# Patient Record
Sex: Male | Born: 1975 | Race: Black or African American | Marital: Married
Health system: Midwestern US, Community
[De-identification: ages and names within clinical notes are randomized; demographics above are authoritative.]

## PROBLEM LIST (undated history)

## (undated) DIAGNOSIS — I10 Essential (primary) hypertension: Secondary | ICD-10-CM

## (undated) DIAGNOSIS — J189 Pneumonia, unspecified organism: Secondary | ICD-10-CM

## (undated) DIAGNOSIS — E119 Type 2 diabetes mellitus without complications: Secondary | ICD-10-CM

---

## 2000-10-19 ENCOUNTER — Emergency Department (HOSPITAL_COMMUNITY): Admission: EM | Admit: 2000-10-19 | Discharge: 2000-10-19 | Payer: Self-pay | Admitting: Internal Medicine

## 2000-10-19 ENCOUNTER — Encounter: Payer: Self-pay | Admitting: Internal Medicine

## 2003-10-17 ENCOUNTER — Emergency Department (HOSPITAL_COMMUNITY): Admission: EM | Admit: 2003-10-17 | Discharge: 2003-10-17 | Payer: Self-pay

## 2004-08-19 ENCOUNTER — Emergency Department (HOSPITAL_COMMUNITY): Admission: EM | Admit: 2004-08-19 | Discharge: 2004-08-19 | Payer: Self-pay | Admitting: Emergency Medicine

## 2004-08-20 ENCOUNTER — Emergency Department (HOSPITAL_COMMUNITY): Admission: EM | Admit: 2004-08-20 | Discharge: 2004-08-20 | Payer: Self-pay

## 2005-04-27 ENCOUNTER — Emergency Department (HOSPITAL_COMMUNITY): Admission: EM | Admit: 2005-04-27 | Discharge: 2005-04-27 | Payer: Self-pay | Admitting: Emergency Medicine

## 2006-12-26 ENCOUNTER — Emergency Department (HOSPITAL_COMMUNITY): Admission: EM | Admit: 2006-12-26 | Discharge: 2006-12-26 | Payer: Self-pay | Admitting: Emergency Medicine

## 2007-02-04 ENCOUNTER — Emergency Department (HOSPITAL_COMMUNITY): Admission: EM | Admit: 2007-02-04 | Discharge: 2007-02-04 | Payer: Self-pay | Admitting: Emergency Medicine

## 2008-04-18 ENCOUNTER — Emergency Department (HOSPITAL_COMMUNITY): Admission: EM | Admit: 2008-04-18 | Discharge: 2008-04-18 | Payer: Self-pay | Admitting: Emergency Medicine

## 2008-09-07 ENCOUNTER — Emergency Department (HOSPITAL_COMMUNITY): Admission: EM | Admit: 2008-09-07 | Discharge: 2008-09-07 | Payer: Self-pay | Admitting: Emergency Medicine

## 2008-10-27 ENCOUNTER — Emergency Department (HOSPITAL_COMMUNITY): Admission: EM | Admit: 2008-10-27 | Discharge: 2008-10-27 | Payer: Self-pay | Admitting: Emergency Medicine

## 2009-09-05 ENCOUNTER — Emergency Department (HOSPITAL_COMMUNITY): Admission: EM | Admit: 2009-09-05 | Discharge: 2009-09-05 | Payer: Self-pay | Admitting: Emergency Medicine

## 2010-08-30 ENCOUNTER — Emergency Department (HOSPITAL_COMMUNITY): Admission: EM | Admit: 2010-08-30 | Discharge: 2010-08-31 | Payer: Self-pay | Admitting: Emergency Medicine

## 2010-09-03 ENCOUNTER — Inpatient Hospital Stay (HOSPITAL_COMMUNITY): Admission: EM | Admit: 2010-09-03 | Discharge: 2010-09-05 | Payer: Self-pay | Admitting: Emergency Medicine

## 2011-01-05 ENCOUNTER — Encounter: Payer: Self-pay | Admitting: Family Medicine

## 2011-02-27 LAB — URINALYSIS, ROUTINE W REFLEX MICROSCOPIC
Glucose, UA: NEGATIVE mg/dL
Hgb urine dipstick: NEGATIVE
Protein, ur: NEGATIVE mg/dL
Specific Gravity, Urine: 1.013 (ref 1.005–1.030)
Urobilinogen, UA: 0.2 mg/dL (ref 0.0–1.0)

## 2011-02-27 LAB — DIFFERENTIAL
Basophils Relative: 0 % (ref 0–1)
Lymphocytes Relative: 20 % (ref 12–46)
Monocytes Absolute: 1.2 10*3/uL — ABNORMAL HIGH (ref 0.1–1.0)
Monocytes Relative: 13 % — ABNORMAL HIGH (ref 3–12)

## 2011-02-27 LAB — HEMOGLOBIN A1C
Hgb A1c MFr Bld: 5.5 % (ref <5.7)
Mean Plasma Glucose: 111 mg/dL (ref <117)

## 2011-02-27 LAB — CBC
Hemoglobin: 13.4 g/dL (ref 13.0–17.0)
MCH: 32.3 pg (ref 26.0–34.0)
MCHC: 33.7 g/dL (ref 30.0–36.0)
MCV: 95.7 fL (ref 78.0–100.0)
Platelets: 289 10*3/uL (ref 150–400)
RBC: 4.15 MIL/uL — ABNORMAL LOW (ref 4.22–5.81)
RDW: 12.6 % (ref 11.5–15.5)

## 2011-02-27 LAB — HEPATIC FUNCTION PANEL
ALT: 54 U/L — ABNORMAL HIGH (ref 0–53)
AST: 26 U/L (ref 0–37)
Albumin: 3.1 g/dL — ABNORMAL LOW (ref 3.5–5.2)
Total Bilirubin: 0.9 mg/dL (ref 0.3–1.2)

## 2011-02-27 LAB — COMPREHENSIVE METABOLIC PANEL
ALT: 83 U/L — ABNORMAL HIGH (ref 0–53)
AST: 40 U/L — ABNORMAL HIGH (ref 0–37)
Albumin: 3.1 g/dL — ABNORMAL LOW (ref 3.5–5.2)
BUN: 12 mg/dL (ref 6–23)
CO2: 23 mEq/L (ref 19–32)
Calcium: 8.4 mg/dL (ref 8.4–10.5)
Chloride: 104 mEq/L (ref 96–112)
Creatinine, Ser: 1.1 mg/dL (ref 0.4–1.5)
GFR calc Af Amer: >60 mL/min (ref >60)
GFR calc Af Amer: >60 mL/min (ref >60)
GFR calc non Af Amer: >60 mL/min (ref >60)
Sodium: 135 mEq/L (ref 135–145)
Total Bilirubin: 1 mg/dL (ref 0.3–1.2)
Total Bilirubin: 1.5 mg/dL — ABNORMAL HIGH (ref 0.3–1.2)
Total Protein: 6.5 g/dL (ref 6.0–8.3)

## 2011-02-27 LAB — CULTURE, BLOOD (ROUTINE X 2): Culture  Setup Time: 201109200226

## 2011-02-27 LAB — URINE CULTURE: Colony Count: NO GROWTH

## 2011-02-27 LAB — HEPATITIS PANEL, ACUTE
Hep A IgM: NEGATIVE
Hep B C IgM: NEGATIVE
Hepatitis B Surface Ag: NEGATIVE

## 2011-03-21 LAB — BASIC METABOLIC PANEL
BUN: 17 mg/dL (ref 6–23)
CO2: 25 mEq/L (ref 19–32)
Calcium: 9 mg/dL (ref 8.4–10.5)
Glucose, Bld: 112 mg/dL — ABNORMAL HIGH (ref 70–99)
Sodium: 138 mEq/L (ref 135–145)

## 2011-03-21 LAB — POCT CARDIAC MARKERS: Troponin i, poc: <0.05 ng/mL (ref 0.00–0.09)

## 2011-03-21 LAB — CK TOTAL AND CKMB (NOT AT ARMC): Total CK: 293 U/L — ABNORMAL HIGH (ref 7–232)

## 2011-03-21 LAB — DIFFERENTIAL
Basophils Relative: 1 % (ref 0–1)
Eosinophils Absolute: 0.2 10*3/uL (ref 0.0–0.7)
Eosinophils Relative: 3 % (ref 0–5)
Monocytes Absolute: 0.7 10*3/uL (ref 0.1–1.0)
Monocytes Relative: 9 % (ref 3–12)

## 2011-03-21 LAB — CBC
HCT: 43.5 % (ref 39.0–52.0)
Hemoglobin: 14.4 g/dL (ref 13.0–17.0)
MCHC: 33.3 g/dL (ref 30.0–36.0)
RDW: 13.1 % (ref 11.5–15.5)

## 2012-12-10 ENCOUNTER — Emergency Department (HOSPITAL_COMMUNITY)
Admission: EM | Admit: 2012-12-10 | Discharge: 2012-12-10 | Disposition: A | Discharge status: Home / Self Care | Payer: Self-pay | Attending: Emergency Medicine | Admitting: Emergency Medicine

## 2012-12-10 ENCOUNTER — Encounter (HOSPITAL_COMMUNITY): Payer: Self-pay | Admitting: Emergency Medicine

## 2012-12-10 DIAGNOSIS — R112 Nausea with vomiting, unspecified: Secondary | ICD-10-CM | POA: Insufficient documentation

## 2012-12-10 DIAGNOSIS — E119 Type 2 diabetes mellitus without complications: Secondary | ICD-10-CM | POA: Insufficient documentation

## 2012-12-10 DIAGNOSIS — R631 Polydipsia: Secondary | ICD-10-CM | POA: Insufficient documentation

## 2012-12-10 DIAGNOSIS — R3589 Other polyuria: Secondary | ICD-10-CM | POA: Insufficient documentation

## 2012-12-10 DIAGNOSIS — R358 Other polyuria: Secondary | ICD-10-CM | POA: Insufficient documentation

## 2012-12-10 DIAGNOSIS — I10 Essential (primary) hypertension: Secondary | ICD-10-CM | POA: Insufficient documentation

## 2012-12-10 DIAGNOSIS — R42 Dizziness and giddiness: Secondary | ICD-10-CM | POA: Insufficient documentation

## 2012-12-10 DIAGNOSIS — R509 Fever, unspecified: Secondary | ICD-10-CM | POA: Insufficient documentation

## 2012-12-10 LAB — CBC WITH DIFFERENTIAL/PLATELET
Basophils Absolute: 0 10*3/uL (ref 0.0–0.1)
Basophils Relative: 0 % (ref 0–1)
Hemoglobin: 14.9 g/dL (ref 13.0–17.0)
MCHC: 35 g/dL (ref 30.0–36.0)
Monocytes Relative: 6 % (ref 3–12)
Neutro Abs: 4 10*3/uL (ref 1.7–7.7)
Neutrophils Relative %: 56 % (ref 43–77)

## 2012-12-10 LAB — BASIC METABOLIC PANEL
BUN: 12 mg/dL (ref 6–23)
GFR calc Af Amer: >90 mL/min (ref >90)
GFR calc non Af Amer: 87 mL/min — ABNORMAL LOW (ref >90)
Potassium: 3.9 mEq/L (ref 3.5–5.1)
Sodium: 133 mEq/L — ABNORMAL LOW (ref 135–145)

## 2012-12-10 LAB — GLUCOSE, CAPILLARY
Glucose-Capillary: 333 mg/dL — ABNORMAL HIGH (ref 70–99)
Glucose-Capillary: 253 mg/dL — ABNORMAL HIGH (ref 70–99)

## 2012-12-10 MED ORDER — SODIUM CHLORIDE 0.9 % IV BOLUS (SEPSIS)
1000.0000 mL | Freq: Once | INTRAVENOUS | Status: AC
  Administered 2012-12-10: 1000 mL via INTRAVENOUS

## 2012-12-10 MED ORDER — METFORMIN HCL 500 MG PO TABS: 500.0000 mg | ORAL_TABLET | Freq: Two times a day (BID) | ORAL | Status: AC

## 2012-12-10 MED ORDER — SODIUM CHLORIDE 0.9 % IV SOLN
Freq: Once | INTRAVENOUS | Status: AC
  Administered 2012-12-10: 21:00:00 via INTRAVENOUS

## 2012-12-10 NOTE — ED Notes (Signed)
Pt c/o fatigue, polydipsia, polyuria, but loss of appetite.  Pt states, "I just feel bad".  Sx x 1 month.

## 2012-12-10 NOTE — ED Notes (Signed)
Per pt states increase thirst/urination for one month-changes in vision as well

## 2012-12-10 NOTE — ED Notes (Signed)
pts CBG was 333.

## 2012-12-10 NOTE — ED Provider Notes (Signed)
History     CSN: [id number]  Arrival date & time 12/10/12  1614   First MD Initiated Contact with Patient 12/10/12 1719      Chief Complaint  Patient presents with  . Fatigue  . Polydipsia  . Polyuria     Patient is a 36 y.o. male presenting with weakness. The history is provided by the patient.  Weakness The primary symptoms include headaches, dizziness and nausea. Primary symptoms do not include loss of consciousness, focal weakness, fever or vomiting. The symptoms began more than 1 week ago. The symptoms are worsening. The neurological symptoms are diffuse.  The headache is associated with weakness.  Dizziness also occurs with nausea and weakness. Dizziness does not occur with vomiting.  Additional symptoms include weakness.  pt reports fatigue, dizziness, nausea, polyuria and polydipsia for past several weeks.  He reports his appetite is decreased as well.  No cp/sob.  No severe abd pain.  No fever is reported.  No heavy ETOH use.  He reports his vision is somewhat blurred as well.  He reports mild headaches as well.  He has no h/o diabetes  PMH - HTN  History reviewed. No pertinent past surgical history.  History reviewed. No pertinent family history.  History  Substance Use Topics  . Smoking status: Never Smoker   . Smokeless tobacco: Not on file  . Alcohol Use: No      Review of Systems  Constitutional: Negative for fever.  Eyes: Positive for visual disturbance.  Respiratory: Negative for shortness of breath.   Cardiovascular: Negative for chest pain.  Gastrointestinal: Positive for nausea. Negative for vomiting.  Skin: Negative for color change.  Neurological: Positive for dizziness, weakness and headaches. Negative for focal weakness and loss of consciousness.  Psychiatric/Behavioral: Negative for agitation.  All other systems reviewed and are negative.    Allergies  Codeine and Penicillins  Home Medications   Current Outpatient Rx  Name  Route  Sig   Dispense  Refill  . ATORVASTATIN CALCIUM 10 MG PO TABS   Oral   Take 10 mg by mouth daily.           BP 152/101  Pulse 87  Temp 98.4 F (36.9 C) (Oral)  Resp 18  SpO2 94%  Physical Exam CONSTITUTIONAL: Well developed/well nourished HEAD AND FACE: Normocephalic/atraumatic EYES: EOMI/PERRL ENMT: Mucous membranes dry NECK: supple no meningeal signs SPINE:entire spine nontender CV: S1/S2 noted, no murmurs/rubs/gallops noted LUNGS: Lungs are clear to auscultation bilaterally, no apparent distress ABDOMEN: soft, nontender, no rebound or guarding. He is obese GU:no cva tenderness NEURO: Pt is awake/alert, moves all extremitiesx4 EXTREMITIES: pulses normal, full ROM SKIN: warm, color normal PSYCH: no abnormalities of mood noted  ED Course  Procedures   Labs Reviewed  GLUCOSE, CAPILLARY - Abnormal; Notable for the following:    Glucose-Capillary 333 (*)     All other components within normal limits  BASIC METABOLIC PANEL  CBC WITH DIFFERENTIAL   5:56 PM Pt with likely new onset diabetes.  He has not eaten all day.  He is in no distress.  He does not smell ketotic.  I doubt DKA.  Will need rehydration and will need to be started on metformin.  He reports he has a PCP he can followup with as outpatient. 8:50 PM Pt reports he feels improved Will start metformin.  Stable for d/c  MDM  Nursing notes including past medical history and social history reviewed and considered in documentation Labs/vital reviewed and considered  Joya Gaskins, MD 12/10/12 2050

## 2013-08-10 ENCOUNTER — Encounter (HOSPITAL_COMMUNITY): Payer: Self-pay

## 2013-08-10 ENCOUNTER — Emergency Department (HOSPITAL_COMMUNITY)
Admission: EM | Admit: 2013-08-10 | Discharge: 2013-08-10 | Disposition: A | Discharge status: Home / Self Care | Payer: Self-pay | Attending: Emergency Medicine | Admitting: Emergency Medicine

## 2013-08-10 ENCOUNTER — Emergency Department (HOSPITAL_COMMUNITY): Payer: Self-pay

## 2013-08-10 ENCOUNTER — Encounter: Payer: Self-pay | Admitting: Family Medicine

## 2013-08-10 ENCOUNTER — Ambulatory Visit (INDEPENDENT_AMBULATORY_CARE_PROVIDER_SITE_OTHER): Payer: Self-pay | Admitting: Family Medicine

## 2013-08-10 VITALS — BP 138/76 | HR 81

## 2013-08-10 DIAGNOSIS — R0602 Shortness of breath: Secondary | ICD-10-CM | POA: Insufficient documentation

## 2013-08-10 DIAGNOSIS — R0789 Other chest pain: Secondary | ICD-10-CM

## 2013-08-10 DIAGNOSIS — E119 Type 2 diabetes mellitus without complications: Secondary | ICD-10-CM | POA: Insufficient documentation

## 2013-08-10 DIAGNOSIS — I10 Essential (primary) hypertension: Secondary | ICD-10-CM | POA: Insufficient documentation

## 2013-08-10 DIAGNOSIS — R110 Nausea: Secondary | ICD-10-CM | POA: Insufficient documentation

## 2013-08-10 DIAGNOSIS — Z7982 Long term (current) use of aspirin: Secondary | ICD-10-CM | POA: Insufficient documentation

## 2013-08-10 DIAGNOSIS — Z79899 Other long term (current) drug therapy: Secondary | ICD-10-CM | POA: Insufficient documentation

## 2013-08-10 DIAGNOSIS — R42 Dizziness and giddiness: Secondary | ICD-10-CM | POA: Insufficient documentation

## 2013-08-10 DIAGNOSIS — R079 Chest pain, unspecified: Secondary | ICD-10-CM

## 2013-08-10 DIAGNOSIS — Z880 Allergy status to penicillin: Secondary | ICD-10-CM | POA: Insufficient documentation

## 2013-08-10 DIAGNOSIS — M79609 Pain in unspecified limb: Secondary | ICD-10-CM | POA: Insufficient documentation

## 2013-08-10 HISTORY — DX: Pneumonia, unspecified organism: J18.9

## 2013-08-10 HISTORY — DX: Type 2 diabetes mellitus without complications: E11.9

## 2013-08-10 HISTORY — DX: Essential (primary) hypertension: I10

## 2013-08-10 LAB — CBC
HCT: 40 % (ref 39.0–52.0)
Platelets: 203 10*3/uL (ref 150–400)
RDW: 12.1 % (ref 11.5–15.5)
WBC: 6.1 10*3/uL (ref 4.0–10.5)

## 2013-08-10 LAB — POCT I-STAT TROPONIN I: Troponin i, poc: 0.01 ng/mL (ref 0.00–0.08)

## 2013-08-10 LAB — BASIC METABOLIC PANEL
Chloride: 105 mEq/L (ref 96–112)
GFR calc Af Amer: >90 mL/min (ref >90)
Potassium: 4 mEq/L (ref 3.5–5.1)
Sodium: 139 mEq/L (ref 135–145)

## 2013-08-10 MED ORDER — ONDANSETRON 4 MG PO TBDP
8.0000 mg | ORAL_TABLET | Freq: Once | ORAL | Status: AC
  Administered 2013-08-10: 8 mg via ORAL
  Filled 2013-08-10: qty 2

## 2013-08-10 MED ORDER — IBUPROFEN 800 MG PO TABS
800.0000 mg | ORAL_TABLET | Freq: Once | ORAL | Status: AC
  Administered 2013-08-10: 800 mg via ORAL
  Filled 2013-08-10: qty 1

## 2013-08-10 NOTE — ED Notes (Signed)
Per [facility], pt from [place] for mid sternal cp radiating to right arm, SOB and nausea with pain. Given 324 mg ASA and 1 NTG pain from 8/10 to 0/10 now. Productive cough with yellow sputum x 2 weeks. 18g to LH, 160/100, 70 HR NSR with BBB on 12 lead by EMS.

## 2013-08-10 NOTE — ED Provider Notes (Signed)
CSN: [id number]     Arrival date & time 08/10/13  1034 History   First MD Initiated Contact with Patient 08/10/13 1035     Chief Complaint  Patient presents with  . Chest Pain   (Consider location/radiation/quality/duration/timing/severity/associated sxs/prior Treatment) HPI Comments: Patient with hx of HTN and DM presents for chest pain with onset this morning. Patient states the pain is in his central and right chest and progressed gradually throughout the morning. Patient denies radiation of the pain, but states he also had pain in his RUE. He describes the pain as sharp and pressure-like; relieved with 324mg  ASA and 1 SL NTG by EMS. Patient admits to associated dizziness, nausea and shortness of breath with symptoms. He denies associated fever, diaphoresis, vision changes, jaw pain, vomiting, abdominal pain, numbness/tingling, and extremity weakness. Patient denies a personal hx of ACS, HLD, CAD, and PE/DVT. Patient denies use of hormone replacement as well as any surgeries or hospitalizations in the past month; denies recent travel or long periods of immobilization. Denies a family hx of ACS and CAD.  The history is provided by the patient. No language interpreter was used.    Past Medical History  Diagnosis Date  . Diabetes mellitus without complication   . Hypertension   . Pneumonia    History reviewed. No pertinent past surgical history. History reviewed. No pertinent family history. History  Substance Use Topics  . Smoking status: Never Smoker   . Smokeless tobacco: Not on file  . Alcohol Use: Yes     Comment: occasionally    Review of Systems  Constitutional: Negative for fever.  Eyes: Negative for visual disturbance.  Respiratory: Positive for shortness of breath.   Cardiovascular: Positive for chest pain.  Gastrointestinal: Positive for nausea. Negative for vomiting and abdominal pain.  Neurological: Positive for dizziness. Negative for weakness and numbness.  All  other systems reviewed and are negative.    Allergies  Codeine and Penicillins  Home Medications   Current Outpatient Rx  Name  Route  Sig  Dispense  Refill  . aspirin 325 MG tablet   Oral   Take 650 mg by mouth daily.         Marland Kitchen ibuprofen (ADVIL,MOTRIN) 800 MG tablet   Oral   Take 800 mg by mouth every 8 (eight) hours as needed for pain.         Marland Kitchen lisinopril (PRINIVIL,ZESTRIL) 10 MG tablet   Oral   Take 10 mg by mouth daily.         . metFORMIN (GLUCOPHAGE) 500 MG tablet   Oral   Take 1 tablet (500 mg total) by mouth 2 (two) times daily with a meal.   60 tablet   0    BP 138/77  Pulse 59  Temp(Src) 98.3 F (36.8 C) (Oral)  Resp 16  Ht 6\' 1"  (1.854 m)  Wt 398 lb (180.532 kg)  BMI 52.52 kg/m2  SpO2 99%  Physical Exam  Nursing note and vitals reviewed. Constitutional: He is oriented to person, place, and time. He appears well-developed and well-nourished. No distress.  Morbidly obese male in no acute distress. In no visible or audible discomfort.  HENT:  Head: Normocephalic and atraumatic.  Eyes: Conjunctivae and EOM are normal. No scleral icterus.  Neck: Normal range of motion.  Cardiovascular: Normal rate, regular rhythm, normal heart sounds and intact distal pulses.   Distal radial pulses 2+ bilaterally  Pulmonary/Chest: Effort normal and breath sounds normal. No respiratory distress. He has no  wheezes. He has no rales.  No dyspnea, tachypnea, retractions, or accessory muscle use.  Abdominal: Soft. There is no tenderness. There is no rebound and no guarding.  Musculoskeletal: Normal range of motion.  Neurological: He is alert and oriented to person, place, and time.  Skin: Skin is warm and dry. No rash noted. He is not diaphoretic. No erythema. No pallor.  Psychiatric: He has a normal mood and affect. His behavior is normal.    ED Course  Procedures (including critical care time) Labs Review Labs Reviewed  BASIC METABOLIC PANEL - Abnormal; Notable  for the following:    Glucose, Bld 157 (*)    GFR calc non Af Amer 90 (*)    All other components within normal limits  CBC  POCT I-STAT TROPONIN I  POCT I-STAT TROPONIN I   Imaging Review Dg Chest Port 1 View  08/10/2013   *RADIOLOGY REPORT*  Clinical Data: Mid to right-sided chest pain radiating to the right arm.  Cough and congestion.  Difficulty breathing with nausea.  PORTABLE CHEST - 1 VIEW  Comparison: CT chest and chest radiograph 09/02/2010.  Findings: Trachea is midline.  Heart size is accentuated by apical lordotic AP semi upright technique.  Lungs are clear.  No pleural fluid.  IMPRESSION: No acute findings.   Original Report Authenticated By: Leanna Battles, M.D.    Date: 08/10/2013  Rate: 72  Rhythm: normal sinus rhythm  QRS Axis: normal  Intervals: normal  ST/T Wave abnormalities: normal  Conduction Disutrbances:right bundle branch block; incomplete  Narrative Interpretation: NSR with incomplete right bundle; no STEMI  Old EKG Reviewed: none available I have personally reviewed and interpreted this EKG  MDM   1. Atypical chest pain    37 year old male with a history of diabetes mellitus and hypertension presents for right-sided chest pain with onset this morning. Patient well and nontoxic appearing and afebrile on arrival. Hemodynamically stable throughout ED course. Physical exam findings as above. Chest pain nonreproducible on palpation. Lungs without leukocytosis, anemia, hemoconcentration, or electrolyte imbalance. Troponin x2 within normal limits. There is no evidence of pneumonia, pleural effusion, pneumothorax, or other acute cardiopulmonary abnormality on chest x-ray. EKG with incomplete right bundle; no evidence of STEMI or ischemic changes. Patient with relief of symptoms after being given 325 mg aspirin and one sublingual nitroglycerin by EMS prior to arrival. He has remained asymptomatic throughout ED course. Doubt ACS given lack of history of ACS, atypical  nature of symptoms, physical exam findings, and reassuring ED workup. Patient with TIMI score of 0. Also doubt pulmonary embolism as the patient has been without tachycardia, tachypnea, dyspnea, or hypoxia. Symptoms likely secondary to increased stress at work. Patient appropriate for discharge with cardiology followup. Also recommend patient have outpatient stress test completed. PCP followup advised. Indications for ED return discussed and patient agreeable to plan with no unaddressed concerns.    Antony Madura, PA-C 08/10/13 1447

## 2013-08-10 NOTE — ED Provider Notes (Signed)
Medical screening examination/treatment/procedure(s) were performed by non-physician practitioner and as supervising physician I was immediately available for consultation/collaboration.   Elior Robinette S Roslynn Holte, MD 08/10/13 2000 

## 2013-08-10 NOTE — ED Notes (Signed)
Phlebotomy at bedside to collect I-stat troponin

## 2013-08-15 DIAGNOSIS — R0789 Other chest pain: Secondary | ICD-10-CM | POA: Insufficient documentation

## 2013-08-15 NOTE — Progress Notes (Signed)
  Subjective:    Patient ID: Fernando Roberts, male    DOB: [birth year], 37 y.o.   MRN: [id number]  HPI New pt.  Walked in w/ CP.  Co-worker from [employer] brought pt here when he complained of severe central and R sided CP while at work.  Pt reports waking up w/ CP, nausea, some SOB.  Did not eat breakfast b/c he was feeling so poorly.  Pt's sxs worsened while at work and became sweaty and clammy.  Pt w/ hx of HTN and DM.  Hx of similar CP 'years ago' but can't recall what work up was done at that time.   Review of Systems For ROS see HPI     Objective:   Physical Exam  Vitals reviewed. Constitutional: He is oriented to person, place, and time. He appears well-developed and well-nourished. He appears distressed (clearly uncomfortable).  HENT:  Head: Normocephalic and atraumatic.  Neck: Normal range of motion. Neck supple. No thyromegaly present.  Cardiovascular: Normal rate, regular rhythm, normal heart sounds and intact distal pulses.   Pulmonary/Chest: Effort normal and breath sounds normal. No respiratory distress. He has no wheezes. He has no rales.  Musculoskeletal: He exhibits no edema.  Lymphadenopathy:    He has no cervical adenopathy.  Neurological: He is alert and oriented to person, place, and time.  Skin: There is pallor.  Clammy, diaphoretic  Psychiatric: He has a normal mood and affect. His behavior is normal. Thought content normal.  Appropriately anxious          Assessment & Plan:

## 2013-08-15 NOTE — Assessment & Plan Note (Signed)
New.  Pt was given 4 81mg  ASA in office, O2 via nasal cannula.  EKG w/out obvious infarction but due to pt's sudden onset of sxs, CAD risk factors (obesity, HTN, DM), and clear distress- 911 was called and pt was transported to [facility]

## 2016-07-16 ENCOUNTER — Emergency Department (HOSPITAL_COMMUNITY)
Admission: EM | Admit: 2016-07-16 | Discharge: 2016-07-16 | Disposition: A | Discharge status: Home / Self Care | Payer: Self-pay | Attending: Emergency Medicine | Admitting: Emergency Medicine

## 2016-07-16 ENCOUNTER — Encounter (HOSPITAL_COMMUNITY): Payer: Self-pay

## 2016-07-16 ENCOUNTER — Emergency Department (HOSPITAL_COMMUNITY): Payer: Self-pay

## 2016-07-16 DIAGNOSIS — I10 Essential (primary) hypertension: Secondary | ICD-10-CM | POA: Insufficient documentation

## 2016-07-16 DIAGNOSIS — Z7984 Long term (current) use of oral hypoglycemic drugs: Secondary | ICD-10-CM | POA: Insufficient documentation

## 2016-07-16 DIAGNOSIS — E119 Type 2 diabetes mellitus without complications: Secondary | ICD-10-CM | POA: Insufficient documentation

## 2016-07-16 DIAGNOSIS — R109 Unspecified abdominal pain: Secondary | ICD-10-CM

## 2016-07-16 DIAGNOSIS — K8020 Calculus of gallbladder without cholecystitis without obstruction: Secondary | ICD-10-CM | POA: Insufficient documentation

## 2016-07-16 LAB — CBC WITH DIFFERENTIAL/PLATELET
BASOS PCT: 1 %
Basophils Absolute: 0 10*3/uL (ref 0.0–0.1)
EOS ABS: 0.2 10*3/uL (ref 0.0–0.7)
EOS PCT: 3 %
HCT: 42.1 % (ref 39.0–52.0)
Hemoglobin: 13.4 g/dL (ref 13.0–17.0)
LYMPHS ABS: 2.1 10*3/uL (ref 0.7–4.0)
Lymphocytes Relative: 33 %
MCH: 31.4 pg (ref 26.0–34.0)
MCHC: 31.8 g/dL (ref 30.0–36.0)
MCV: 98.6 fL (ref 78.0–100.0)
MONO ABS: 0.5 10*3/uL (ref 0.1–1.0)
MONOS PCT: 7 %
Neutro Abs: 3.6 10*3/uL (ref 1.7–7.7)
Neutrophils Relative %: 56 %
PLATELETS: 233 10*3/uL (ref 150–400)
RBC: 4.27 MIL/uL (ref 4.22–5.81)
RDW: 12.2 % (ref 11.5–15.5)
WBC: 6.4 10*3/uL (ref 4.0–10.5)

## 2016-07-16 LAB — URINALYSIS, ROUTINE W REFLEX MICROSCOPIC
BILIRUBIN URINE: NEGATIVE
Glucose, UA: NEGATIVE mg/dL
HGB URINE DIPSTICK: NEGATIVE
Ketones, ur: NEGATIVE mg/dL
Leukocytes, UA: NEGATIVE
Nitrite: NEGATIVE
PH: 7 (ref 5.0–8.0)
Protein, ur: NEGATIVE mg/dL
SPECIFIC GRAVITY, URINE: 1.008 (ref 1.005–1.030)

## 2016-07-16 LAB — COMPREHENSIVE METABOLIC PANEL
ALBUMIN: 3.9 g/dL (ref 3.5–5.0)
ALK PHOS: 59 U/L (ref 38–126)
ALT: 27 U/L (ref 17–63)
AST: 25 U/L (ref 15–41)
Anion gap: 7 (ref 5–15)
BILIRUBIN TOTAL: 1.6 mg/dL — AB (ref 0.3–1.2)
BUN: 10 mg/dL (ref 6–20)
CALCIUM: 9.3 mg/dL (ref 8.9–10.3)
CO2: 26 mmol/L (ref 22–32)
CREATININE: 1.43 mg/dL — AB (ref 0.61–1.24)
Chloride: 109 mmol/L (ref 101–111)
GFR calc Af Amer: >60 mL/min (ref >60)
GFR calc non Af Amer: 60 mL/min — ABNORMAL LOW (ref >60)
GLUCOSE: 94 mg/dL (ref 65–99)
Potassium: 3.7 mmol/L (ref 3.5–5.1)
SODIUM: 142 mmol/L (ref 135–145)
TOTAL PROTEIN: 7.1 g/dL (ref 6.5–8.1)

## 2016-07-16 LAB — LIPASE, BLOOD: Lipase: 27 U/L (ref 11–51)

## 2016-07-16 MED ORDER — ONDANSETRON HCL 4 MG/2ML IJ SOLN
4.0000 mg | Freq: Once | INTRAMUSCULAR | Status: AC
  Administered 2016-07-16: 4 mg via INTRAVENOUS
  Filled 2016-07-16: qty 2

## 2016-07-16 MED ORDER — FENTANYL CITRATE (PF) 100 MCG/2ML IJ SOLN
75.0000 ug | Freq: Once | INTRAMUSCULAR | Status: AC
  Administered 2016-07-16: 75 ug via INTRAVENOUS
  Filled 2016-07-16: qty 2

## 2016-07-16 MED ORDER — FENTANYL CITRATE (PF) 100 MCG/2ML IJ SOLN
50.0000 ug | INTRAMUSCULAR | Status: DC | PRN
  Administered 2016-07-16: 50 ug via NASAL

## 2016-07-16 MED ORDER — IOPAMIDOL (ISOVUE-300) INJECTION 61%
INTRAVENOUS | Status: AC
  Administered 2016-07-16: 100 mL
  Filled 2016-07-16: qty 100

## 2016-07-16 MED ORDER — SODIUM CHLORIDE 0.9 % IV SOLN
INTRAVENOUS | Status: DC
  Administered 2016-07-16: 14:00:00 via INTRAVENOUS

## 2016-07-16 MED ORDER — FENTANYL CITRATE (PF) 100 MCG/2ML IJ SOLN
INTRAMUSCULAR | Status: AC
  Filled 2016-07-16: qty 2

## 2016-07-16 MED ORDER — IBUPROFEN 400 MG PO TABS: 400.0000 mg | ORAL_TABLET | Freq: Four times a day (QID) | ORAL | 0 refills | Status: AC | PRN

## 2016-07-16 NOTE — ED Provider Notes (Signed)
[facility]-EMERGENCY DEPT Provider Note   CSN: [id number] Arrival date & time: 07/16/16  0100  First Provider Contact:  None       History   Chief Complaint Chief Complaint  Patient presents with  . Flank Pain    HPI Fernando Roberts is a 40 y.o. male.  Started 2.5 weeks ago but was not that bad.  Thought it might be a pulled muscle.  Today it became more severe.  Started to get nauseated.  It became very severe so he came to the ED.  The pain is on the right side.  He has noticed some urinary frequency recently.    The history is provided by the patient.  Flank Pain  This is a new problem. The current episode started more than 1 week ago. The problem has been rapidly worsening. Pertinent negatives include no shortness of breath. Nothing aggravates the symptoms. Nothing relieves the symptoms. He has tried nothing for the symptoms.    Past Medical History:  Diagnosis Date  . Diabetes mellitus without complication (HCC)   . Hypertension   . Pneumonia     Patient Active Problem List   Diagnosis Date Noted  . Chest pain radiating to arm 08/15/2013    History reviewed. No pertinent surgical history.     Home Medications    Prior to Admission medications   Medication Sig Start Date End Date Taking? Authorizing Provider  ergocalciferol (VITAMIN D2) 50000 units capsule Take 50,000 Units by mouth once a week. monday   Yes Historical Provider, MD  lisinopril (PRINIVIL,ZESTRIL) 10 MG tablet Take 10 mg by mouth daily.   Yes Historical Provider, MD  metFORMIN (GLUCOPHAGE) 500 MG tablet Take 1 tablet (500 mg total) by mouth 2 (two) times daily with a meal. 12/10/12  Yes Zadie Rhine, MD  ibuprofen (ADVIL,MOTRIN) 400 MG tablet Take 1 tablet (400 mg total) by mouth every 6 (six) hours as needed. 07/16/16   Linwood Dibbles, MD    Family History History reviewed. No pertinent family history.  Social History Social History  Substance Use Topics  . Smoking status: Never Smoker  . Smokeless  tobacco: Never Used  . Alcohol use Yes     Comment: occasionally     Allergies   Codeine and Penicillins   Review of Systems Review of Systems  Constitutional: Negative for fever.  Respiratory: Negative for shortness of breath.   Gastrointestinal: Positive for nausea. Negative for diarrhea and vomiting.  Genitourinary: Positive for flank pain.     Physical Exam Updated Vital Signs BP 141/91   Pulse 68   Temp 98.3 F (36.8 C) (Oral)   Resp 26   Ht 6\' 1"  (1.854 m)   Wt (!) 180.5 kg   SpO2 100%   BMI 52.51 kg/m   Physical Exam  Constitutional: He appears well-developed and well-nourished. No distress.  HENT:  Head: Normocephalic and atraumatic. Head is without raccoon's eyes and without Battle's sign.  Right Ear: External ear normal.  Left Ear: External ear normal.  Eyes: Lids are normal. Right eye exhibits no discharge. Right conjunctiva has no hemorrhage. Left conjunctiva has no hemorrhage.  Neck: No spinous process tenderness present. No tracheal deviation and no edema present.  Cardiovascular: Normal rate, regular rhythm and normal heart sounds.   Pulmonary/Chest: Effort normal and breath sounds normal. No stridor. No respiratory distress. He exhibits no tenderness, no crepitus and no deformity.  Abdominal: Soft. Normal appearance and bowel sounds are normal. He exhibits no distension and no  mass. There is no tenderness. There is CVA tenderness (right). No hernia.  Negative for seat belt sign  Musculoskeletal:       Cervical back: He exhibits no tenderness, no swelling and no deformity.       Thoracic back: He exhibits no tenderness, no swelling and no deformity.       Lumbar back: He exhibits no tenderness and no swelling.  Pelvis stable, no ttp  Neurological: He is alert. He has normal strength. No sensory deficit. He exhibits normal muscle tone. GCS eye subscore is 4. GCS verbal subscore is 5. GCS motor subscore is 6.  Able to move all extremities, sensation  intact throughout  Skin: He is not diaphoretic.  Psychiatric: He has a normal mood and affect. His speech is normal and behavior is normal.  Nursing note and vitals reviewed.    ED Treatments / Results  Labs (all labs ordered are listed, but only abnormal results are displayed) Labs Reviewed  COMPREHENSIVE METABOLIC PANEL - Abnormal; Notable for the following:       Result Value   Creatinine, Ser 1.43 (*)    Total Bilirubin 1.6 (*)    GFR calc non Af Amer 60 (*)    All other components within normal limits  URINALYSIS, ROUTINE W REFLEX MICROSCOPIC (NOT AT [facility])  LIPASE, BLOOD  CBC WITH DIFFERENTIAL/PLATELET     Radiology Ct Abdomen Pelvis W Contrast  Result Date: 07/16/2016 CLINICAL DATA:  Right flank pain for several weeks. EXAM: CT ABDOMEN AND PELVIS WITH CONTRAST TECHNIQUE: Multidetector CT imaging of the abdomen and pelvis was performed using the standard protocol following bolus administration of intravenous contrast. CONTRAST:  ISOVUE-300 IOPAMIDOL (ISOVUE-300) INJECTION 61% COMPARISON:  CT scan of September 02, 2010. FINDINGS: Moderate degenerative disc disease is noted at L4-5. Visualized lung bases are unremarkable. Small solitary gallstone is noted. Fatty infiltration of the liver is noted. Spleen and pancreas are unremarkable. Adrenal glands and kidneys appear normal. No hydronephrosis or renal obstruction is noted. The appendix appears normal. There is no evidence of bowel obstruction. No abnormal fluid collection is noted. Urinary bladder appears normal. No significant adenopathy is noted. IMPRESSION: Small solitary gallstone. Fatty infiltration of the liver. No other abnormality seen in the abdomen or pelvis. Electronically Signed   By: Lupita Raider, M.D.   On: 07/16/2016 15:51    Procedures Procedures (including critical care time)  Medications Ordered in ED Medications  fentaNYL (SUBLIMAZE) injection 50 mcg (50 mcg Nasal Given 07/16/16 1213)  fentaNYL  (SUBLIMAZE) 100 MCG/2ML injection (not administered)  0.9 %  sodium chloride infusion ( Intravenous New Bag/Given 07/16/16 1402)  ondansetron (ZOFRAN) injection 4 mg (4 mg Intravenous Given 07/16/16 1402)  fentaNYL (SUBLIMAZE) injection 75 mcg (75 mcg Intravenous Given 07/16/16 1402)  iopamidol (ISOVUE-300) 61 % injection (100 mLs  Contrast Given 07/16/16 1518)     Initial Impression / Assessment and Plan / ED Course  I have reviewed the triage vital signs and the nursing notes.  Pertinent labs & imaging results that were available during my care of the patient were reviewed by me and considered in my medical decision making (see chart for details).  Clinical Course  Comment By Time  Bili elevated.  No blood in the urine.  Will CT abdomen pelvis to evaluate further.  Not clearly renal colic or biliary colic at this time Linwood Dibbles, MD 08/02 1418    Patient's CT scan shows a solitary gallstone without any evidence of inflammation. I'm  not sure that this gallstone as the source of the patient's pain.  His symptoms do not seem to be affecting his appetite and do not seem to be triggered by meals. I think it's reasonable him to follow up with a surgeon for outpatient consultation. I recommend he return to the emergency room if he has fever or vomiting or worsening symptoms.  Final Clinical Impressions(s) / ED Diagnoses   Final diagnoses:  Flank pain  Gallstone    New Prescriptions New Prescriptions   IBUPROFEN (ADVIL,MOTRIN) 400 MG TABLET    Take 1 tablet (400 mg total) by mouth every 6 (six) hours as needed.     Linwood Dibbles, MD 07/16/16 [phone]

## 2016-07-16 NOTE — ED Notes (Signed)
Pt returned from CT. Pt on monitors 

## 2016-07-16 NOTE — ED Notes (Signed)
Nurse First brought pt back c/o right flank pain 10/10 requesting pain meds.

## 2016-07-16 NOTE — ED Triage Notes (Signed)
PT came to the nurse first desk to report he was going to leave if he did not get some pain meds. Pt was moved to triage for for pain eval by triage nurse.

## 2016-07-16 NOTE — ED Notes (Signed)
Pt in ct 

## 2016-07-16 NOTE — ED Triage Notes (Signed)
Pt reports right flank pain X2 weeks, no hx of kidney stone but also reports frequent urination.

## 2017-11-20 IMAGING — CT CT ABD-PELV W/ CM
2 of 5 series · 17 of 46 positions shown, 19 images · IV contrast (APPLIED)
Comparison: CT scan of September 02, 2010.

CLINICAL DATA: Right flank pain for several weeks.

EXAM:
CT ABDOMEN AND PELVIS WITH CONTRAST
TECHNIQUE: Multidetector CT imaging of the abdomen and pelvis was performed
using the standard protocol following bolus administration of
intravenous contrast.
CONTRAST:  100mL AU4TJR-TFF IOPAMIDOL (AU4TJR-TFF) INJECTION 61%

[Series 2: abd/ pelvis 5.0 i30f 1 · axial · 0.95mm/px · z∈[+776,+1292]mm · 14 of 115 slices shown, 16 images]
[im 6/115  soft-tissue]
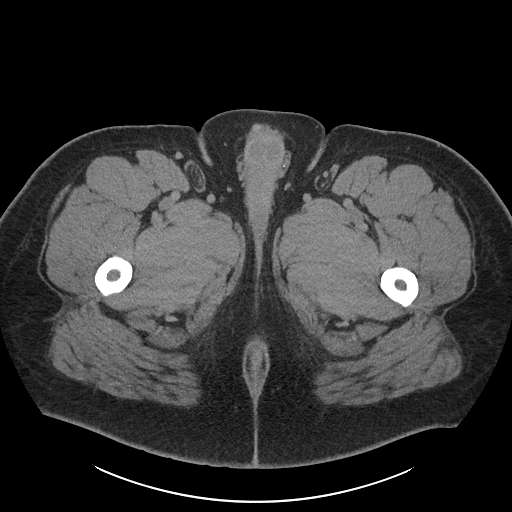
[im 6/115  bone]
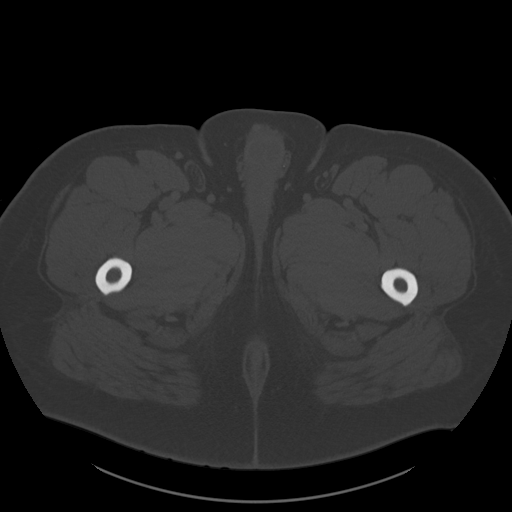
[im 18/115  soft-tissue]
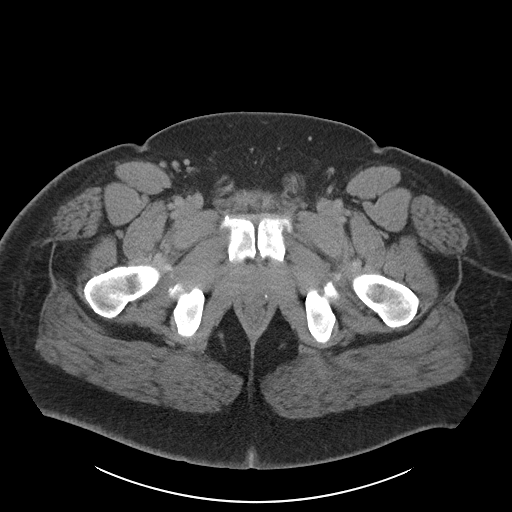
[im 23/115  soft-tissue]
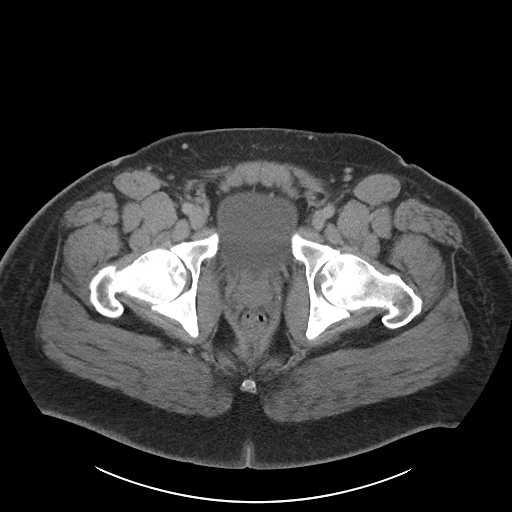
[im 29/115  soft-tissue]
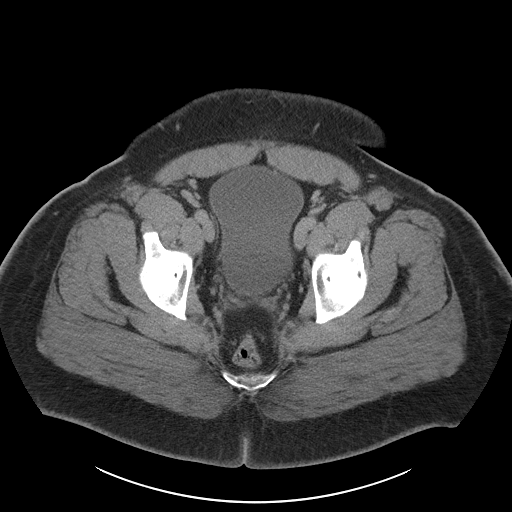
[im 40/115  soft-tissue]
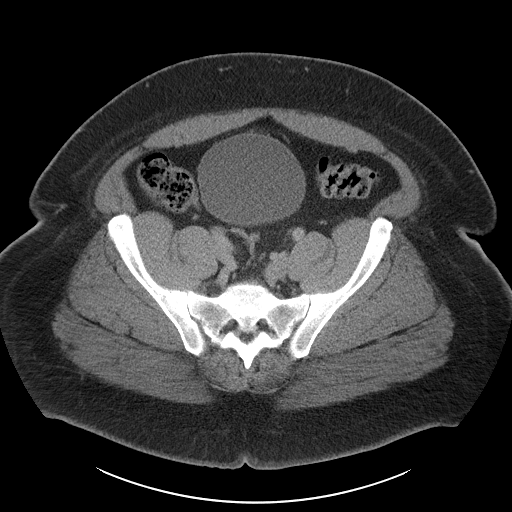
[im 46/115  soft-tissue]
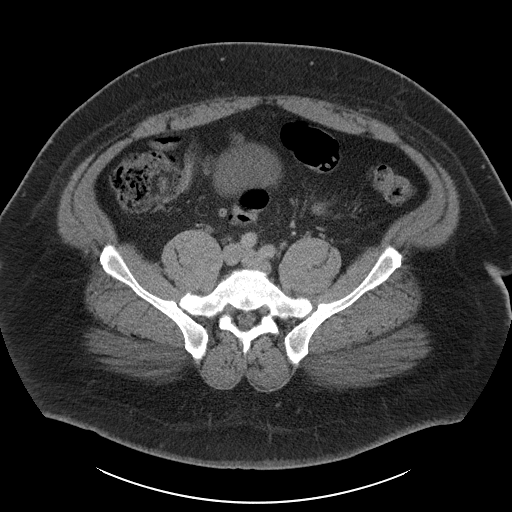
[im 52/115  soft-tissue]
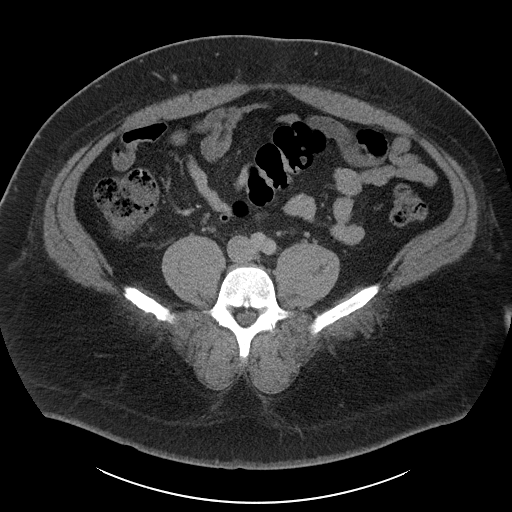
[im 63/115  soft-tissue]
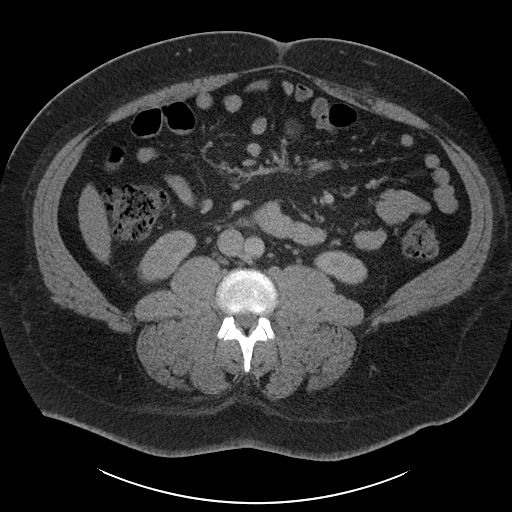
[im 69/115  soft-tissue]
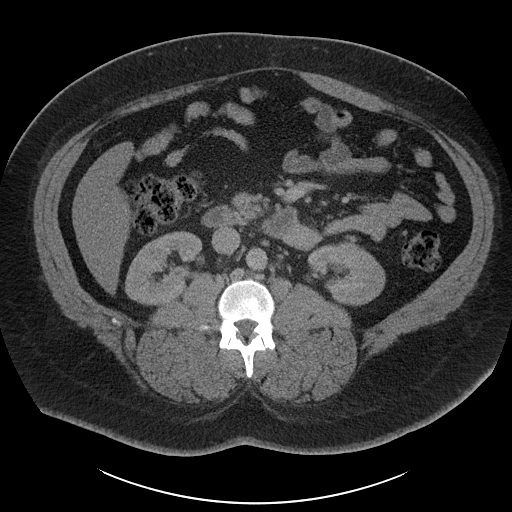
[im 69/115  bone]
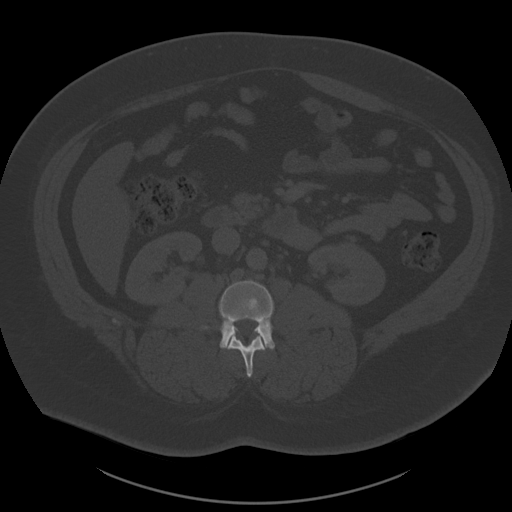
[im 75/115  soft-tissue]
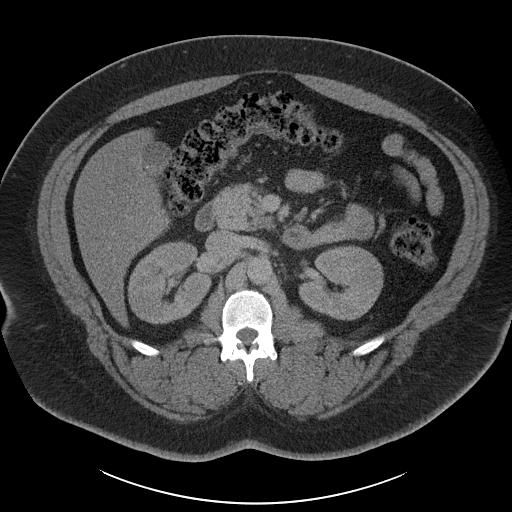
[im 86/115  soft-tissue]
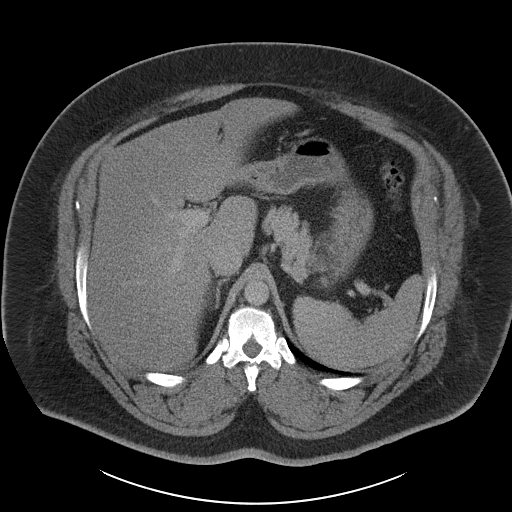
[im 92/115  soft-tissue]
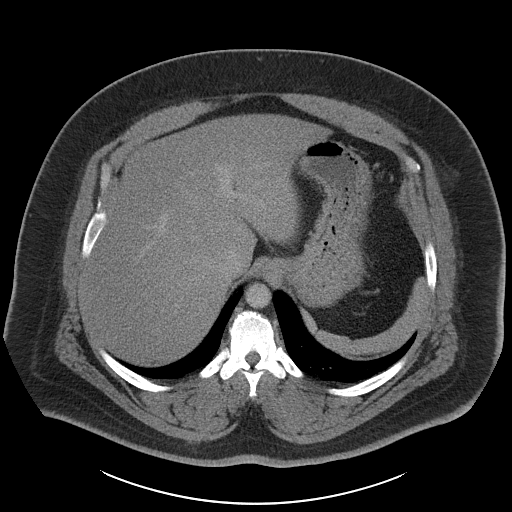
[im 97/115  soft-tissue]
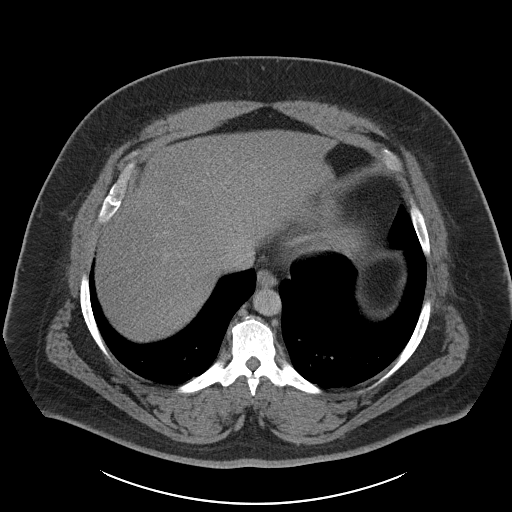
[im 109/115  soft-tissue]
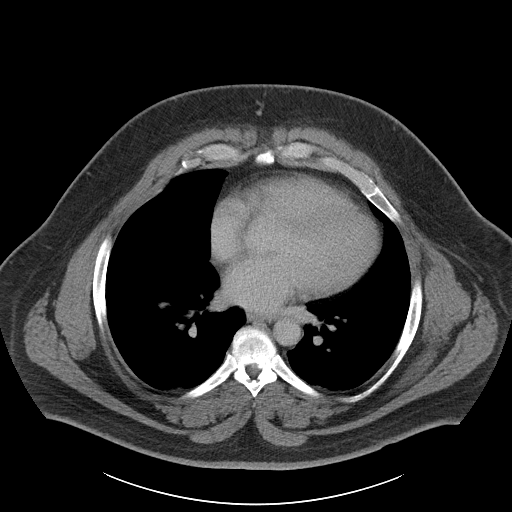

[Series 5: coronal soft tissue · coronal · 1.04mm/px · 3 of 125 slices shown]
[im 42/125  soft-tissue]
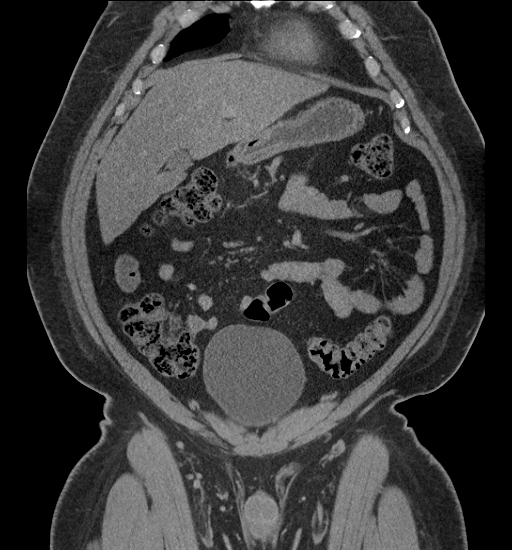
[im 56/125  soft-tissue]
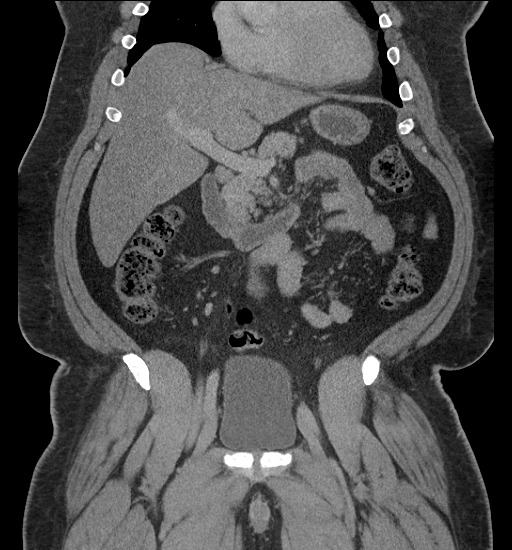
[im 69/125  soft-tissue]
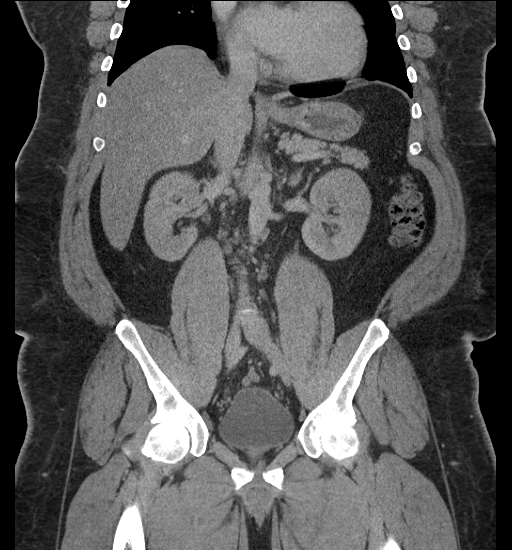

[17 of 46 positions shown; findings below may reference images not displayed]

FINDINGS: Moderate degenerative disc disease is noted at L4-5. Visualized lung
bases are unremarkable.

Small solitary gallstone is noted. Fatty infiltration of the liver
is noted. Spleen and pancreas are unremarkable. Adrenal glands and
kidneys appear normal. No hydronephrosis or renal obstruction is
noted. The appendix appears normal. There is no evidence of bowel
obstruction. No abnormal fluid collection is noted. Urinary bladder
appears normal. No significant adenopathy is noted.
IMPRESSION: Small solitary gallstone. Fatty infiltration of the liver. No other
abnormality seen in the abdomen or pelvis.

## 2021-07-15 NOTE — Telephone Encounter (Signed)
Formatting of this note is different from the original.  PC to patient for payment see email copied below. Appointments canceled and patient will contact us when he is ready to reschedule.  [place] aware.    From: Garnet Koyanagi A. [email or web address]   Sent: Monday, July 15, 2021 12:26 PM  To: Marchesini, Sherry [email or web address]>; Benna Dunks, Heidi A. [email or web address]>; Valera Castle. [email or web address]>; Cherylann Ratel [email or web address]>  Subject: RE: payment    Hi all    I contacted Abou Sterkel III Mrn [phone] and he is cancelling his appt fr now and call when he is able to do process.    From: Loraine Maple [email or web address]   Sent: Monday, July 15, 2021 9:23 AM  To: Dorthula Rue A. [email or web address]>; Valera Castle. [email or web address]>; Cherylann Ratel [email or web address]>; Eitzen, Cordelia Pen A. [email or web address]>  Subject: RE: payment    Hi Cordelia Pen,    Since Angelique Blonder has been off Friday and today can you call the following patients to collect $864.00 Evaluation payment.    Patient MRN  DOS Service Provider Due   OSA, CAMPOLI [id number] 08/06/2021 Evaluation [place] $864.00       Thank you,  Cordelia Pen    Electronically signed by Tiana Loft, RN at 07/15/2021  1:15 PM EDT

## 2022-12-02 NOTE — Progress Notes (Signed)
Formatting of this note is different from the original.  Subjective     HPI:  John Brady is a 46 y.o.  male who presents to the office with:    Chief Complaint   Patient presents with   ? Follow-up     Patient states he is dealing with sudden abdominal pain, bad headaches, dizziness and acid reflex. These symptom's are daily issues x 3 weeks denies fever. Has been taking OTC pain medications and Alka-Seltzer to try to make himself feel better. MD     Patient reports epigastric abdominal pain and in general not feeling well for the past 3 weeks.  No fever cough or URI symptoms.    Ibuprofen 2 pills- every 8 hours.  for chronic knee pain-we reviewed and he is aware that NSAID contraindicated  for patient status post bariatric surgery  Alcohol - beer 48+ ounces daily  No tobacco.    Not taking HCTZ regularly for BP     BP prescribed venlafaxine for depression in March, he does not report adequate trial and thinks depression may be playing a role in compliance.  BP Readings from Last 3 Encounters:   12/02/22 (!) 142/100   03/16/22 (!) 139/91   03/11/22 138/88     Wt Readings from Last 3 Encounters:   12/02/22 (!) 356 lb 9.6 oz (161.8 kg)   03/16/22 (!) 352 lb (159.7 kg)   03/11/22 (!) 352 lb 12.8 oz (160 kg)     ROS: Negative for constitutional, CV, Resp, Abd/GU symptoms except as noted above in HPI.    PMH: Past medical history, Past surgical history, Social history, family history were reviewed as noted in EMR.  Pertinent for:   Past Medical History:   Diagnosis Date   ? Abdominal pain, unspecified site    ? Contact dermatitis    ? Depression    ? Dermatophytosis of nail    ? Diabetes mellitus (*)    ? Disease of thyroid gland     PATIENT DENIES   ? Essential hypertension    ? Fever, unspecified    ? Hyperlipidemia    ? Obstructive sleep apnea syndrome    ? Pain in joint, lower leg    ? Plantar fasciitis    ? Pneumonia    ? Primary osteoarthritis of knee    ? Type 2 diabetes mellitus with hyperglycemia (*)      FSBS RANGE 110-120 04/06/17   ? Unspecified internal derangement of knee      Medications and allergies reviewed.    Objective     Physical Exam:   Vital Signs: BP (!) 142/100 (BP Location: Right arm, Patient Position: Lying)   Pulse 89   Temp 97.5 F (36.4 C) (Temporal)   Ht 6\' 1"  (1.854 m)   Wt (!) 356 lb 9.6 oz (161.8 kg)   SpO2 95%   BMI 47.05 kg/m    General:  Well appearing, Alert & Oriented  CV:  Normal to palpation without thrill.  Regular Rate and Rhythm, No murmurs, rubs, or gallops.    Respiratory:  Normal respiratory effort.  No wheezes or crackles.  Good air movement without diminished sounds.    Abdomen:  Normal bowel sounds.  Non-tender to palpation.  No rebound or guarding.  No palpable liver or spleen.  Extremities:  No pretibial edema    Assessment/Plan     Orders Placed This Encounter   Procedures   ? Comprehensive Metabolic Panel   ?  CBC And Differential   ? Hemoglobin A1c   ? TSH Rfx on Abnormal to Free T4   ? Lipase   ? H. pylori breath test   ? T4 Free     Episode of recurrent major depressive disorder (*)   Does not recall what he has taken in the past or any adverse effects-perhaps some erectile dysfunction but not clear if this was already present at baseline does admit an association between alcohol use and ED.  Prescribed venlafaxine in March and it looks like several years ago may have been prescribed Lexapro.  After discussion patient would like to restart venlafaxine and request a new prescription.  Encourage therapy.  Will reassess closely in 6 weeks    H/O gastric bypass   Reviewed importance of NSAID avoidance    Alcohol abuse, daily use   We discussed alcohol use is a risk factor for depression, ulcers, pancreatitis, liver dysfunction, hypertension and recommended cutting back.  He declines further support at this time    Abdominal pain   Patient reports epigastric abdominal pain and on exam patient has some mild right upper quadrant pain without rebound.  Patient also  reports back pain, possibly musculoskeletal.  No history of kidney stones.  We discussed differential includes pancreatitis, peptic ulcer, known cholelithiasis , musculoskeletal back pain and less likely kidney stones.    Heart with labs today to further narrow differential.  Will start PPI, recommended decreasing alcohol use, bland diet and will monitor closely.  Discussed red flags for urgent reevaluation    Essential hypertension with goal blood pressure less than 130/80   BP elevated, encouraged to restart hctz    Follow up in about 6 weeks (around 01/13/2023).    Future Appointments   Date Time Provider Department Center   01/13/2023 12:00 PM Misty Stanley, PA-C [facility] None     Medications at end of visit today:    Current Outpatient Medications:   ?  albuterol sulfate HFA (VENTOLIN HFA) 108 (90 Base) MCG/ACT inhaler, Inhale one puff into the lungs every 6 (six) hours as needed for Wheezing., Disp: 6.7 g, Rfl: 0  ?  amLODIPine besylate (NORVASC) 10 mg tablet, Take one tablet (10 mg dose) by mouth daily., Disp: 90 tablet, Rfl: 1  ?  fluticasone propionate (FLONASE) 50 mcg/actuation nasal spray, two sprays by Nasal route daily., Disp: 16 g, Rfl: 1  ?  hydrochlorothiazide (HYDRODIURIL) 25 mg tablet, Take one tablet (25 mg dose) by mouth daily. Take on tablet by mouth once a day in the morning., Disp: 90 tablet, Rfl: 1  ?  omeprazole (PRILOSEC) 20 mg capsule, Take one capsule (20 mg dose) by mouth daily., Disp: 180 capsule, Rfl: 0  ?  tadalafil (CIALIS) 10 MG tablet, TAKE 1 TABLET BY MOUTH ONCE DAILY AS NEEDED, Disp: 30 tablet, Rfl: 0  ?  venlafaxine HCl (EFFEXOR XR) 37.5 mg 24 hr capsule, Take one capsule (37.5 mg dose) by mouth daily., Disp: 30 capsule, Rfl: 2    Electronically signed by Macy Mis, MD at 12/03/2022  8:01 AM EST

## 2022-12-03 NOTE — Assessment & Plan Note (Signed)
Associated Problem(s): Episode of recurrent major depressive disorder (*)  Formatting of this note might be different from the original.   Does not recall what he has taken in the past or any adverse effects-perhaps some erectile dysfunction but not clear if this was already present at baseline does admit an association between alcohol use and ED.  Prescribed venlafaxine in March and it looks like several years ago may have been prescribed Lexapro.  After discussion patient would like to restart venlafaxine and request a new prescription.  Encourage therapy.  Will reassess closely in 6 weeks  Electronically signed by Katherina Mires, MD at 12/03/2022  7:41 AM EST

## 2022-12-03 NOTE — Assessment & Plan Note (Signed)
Associated Problem(s): H/O gastric bypass  Formatting of this note might be different from the original.   Reviewed importance of NSAID avoidance  Electronically signed by Katherina Mires, MD at 12/03/2022  7:41 AM EST

## 2022-12-03 NOTE — Assessment & Plan Note (Signed)
Associated Problem(s): Essential hypertension with goal blood pressure less than 130/80  Formatting of this note might be different from the original.   BP elevated, encouraged to restart hctz  Electronically signed by Katherina Mires, MD at 12/03/2022  8:01 AM EST

## 2022-12-03 NOTE — Assessment & Plan Note (Signed)
Associated Problem(s): Alcohol abuse, daily use  Formatting of this note might be different from the original.   We discussed alcohol use is a risk factor for depression, ulcers, pancreatitis, liver dysfunction, hypertension and recommended cutting back.  He declines further support at this time  Electronically signed by Katherina Mires, MD at 12/03/2022  7:42 AM EST

## 2022-12-03 NOTE — Assessment & Plan Note (Signed)
Associated Problem(s): Abdominal pain  Formatting of this note might be different from the original.   Patient reports epigastric abdominal pain and on exam patient has some mild right upper quadrant pain without rebound.  Patient also reports back pain, possibly musculoskeletal.  No history of kidney stones.  We discussed differential includes pancreatitis, peptic ulcer, known cholelithiasis , musculoskeletal back pain and less likely kidney stones.    Heart with labs today to further narrow differential.  Will start PPI, recommended decreasing alcohol use, bland diet and will monitor closely.  Discussed red flags for urgent reevaluation  Electronically signed by Katherina Mires, MD at 12/03/2022  7:44 AM EST

## 2022-12-19 ENCOUNTER — Inpatient Hospital Stay
Admit: 2022-12-19 | Discharge: 2022-12-19 | Disposition: A | Discharge status: Home / Self Care | Payer: BLUE CROSS/BLUE SHIELD | Attending: Emergency Medicine

## 2022-12-19 ENCOUNTER — Emergency Department: Admit: 2022-12-19 | Payer: BLUE CROSS/BLUE SHIELD

## 2022-12-19 DIAGNOSIS — S29011A Strain of muscle and tendon of front wall of thorax, initial encounter: Secondary | ICD-10-CM

## 2022-12-19 MED ORDER — KETOROLAC TROMETHAMINE 30 MG/ML IJ SOLN
30 MG/ML | Freq: Once | INTRAMUSCULAR | Status: AC
Start: 2022-12-19 — End: 2022-12-19
  Administered 2022-12-19: 22:00:00 30 mg via INTRAMUSCULAR

## 2022-12-19 MED ORDER — NAPROXEN 500 MG PO TABS
500 MG | ORAL_TABLET | Freq: Two times a day (BID) | ORAL | 0 refills | Status: AC | PRN
Start: 2022-12-19 — End: 2022-12-24

## 2022-12-19 MED ORDER — DOXYCYCLINE HYCLATE 100 MG PO CAPS
100 MG | ORAL_CAPSULE | Freq: Two times a day (BID) | ORAL | 0 refills | Status: AC
Start: 2022-12-19 — End: 2022-12-29

## 2022-12-19 MED ORDER — DOXYCYCLINE HYCLATE 100 MG PO TABS
100 MG | Freq: Once | ORAL | Status: AC
Start: 2022-12-19 — End: 2022-12-19
  Administered 2022-12-19: 22:00:00 100 mg via ORAL

## 2022-12-19 MED FILL — KETOROLAC TROMETHAMINE 30 MG/ML IJ SOLN: 30 MG/ML | INTRAMUSCULAR | Qty: 1

## 2022-12-19 MED FILL — DOXYCYCLINE HYCLATE 100 MG PO TABS: 100 MG | ORAL | Qty: 1

## 2022-12-19 NOTE — Discharge Instructions (Signed)
Please follow-up with your primary care physician in 2 to 3 days.  Please use your incentive spirometer 4-5 times per hour, every hour while awake until you are completely pain-free without the use of pain medications.  Please return to the emergency department should your symptoms persist or worsen, develop fever, difficulty breathing, chest pain, develop spreading redness around the area of concern (left shoulder), and or develop any other concerns.

## 2022-12-19 NOTE — ED Provider Notes (Signed)
[facility] DE[facility]  EMERGENCY DEPARTMENT ENCOUNTER      Pt Name: John Brady  MRN: [id number]  Birthdate [birth year]  Date of evaluation: 12/19/2022  Provider: Hollace Kinnier, MD    CHIEF COMPLAINT       Chief Complaint   Patient presents with    Back Pain     R sided rib/lower shoulder pain after lifting something up yesterday. Also sts he has a cyst on his L shoulder that he would like to have looked at as well         HISTORY OF PRESENT ILLNESS    47 year old male presents to the emergency room with right rib pain that began after lifting something heavy yesterday.  Patient reports the pain is worse with palpation and truncal twisting motion.  No shortness breath.  No fever.  No vomiting.  He also is concerned for a possible cyst to his left shoulder which began a couple days ago.  Patient denies taking anything for his pain prior to arrival.              Nursing Notes were reviewed.  REVIEW OF SYSTEMS     Review of Systems   Constitutional:  Negative for fever.   Respiratory:  Negative for shortness of breath.        Except as noted above the remainder of the review of systems was reviewed and negative.     PAST MEDICAL HISTORY   No past medical history on file.    SURGICAL HISTORY     No past surgical history on file.    CURRENT MEDICATIONS       Previous Medications    No medications on file       ALLERGIES     Codeine and Penicillins    FAMILY HISTORY     No family history on file.     SOCIAL HISTORY       Social History     Socioeconomic History    Marital status: Married       SCREENINGS         Glasgow Coma Scale  Eye Opening: Spontaneous  Best Verbal Response: Oriented  Best Motor Response: Obeys commands  Glasgow Coma Scale Score: 15                     CIWA Assessment  BP: (!) 164/105  Pulse: 74                 PHYSICAL EXAM         ED Triage Vitals   BP Temp Temp src Pulse Respirations SpO2 Height Weight - Scale   12/19/22 1611 12/19/22 1610 -- 12/19/22 1610 12/19/22 1610 12/19/22 1610 12/19/22 1610  12/19/22 1610   (!) 181/118 98.8 F (37.1 C)  85 18 98 % 1.854 m (6\' 1" ) (!) 158.8 kg (350 lb)       Physical Exam  Vitals and nursing note reviewed.   Constitutional:       Appearance: Normal appearance.   HENT:      Head: Normocephalic and atraumatic.   Cardiovascular:      Rate and Rhythm: Normal rate and regular rhythm.   Pulmonary:      Effort: Pulmonary effort is normal.      Breath sounds: Normal breath sounds.   Abdominal:      General: Abdomen is flat.      Palpations: Abdomen is soft.      Tenderness:  There is no abdominal tenderness.   Musculoskeletal:         General: Normal range of motion.      Cervical back: Normal range of motion and neck supple.   Skin:     General: Skin is warm and dry.      Comments: left shoulder: Active pus drainage from the left shoulder with no surrounding erythema.   Neurological:      General: No focal deficit present.      Mental Status: He is alert.   Psychiatric:         Mood and Affect: Mood normal.         Behavior: Behavior normal.         PROCEDURES:  Unless otherwise noted below, none     Procedures    DIAGNOSTIC RESULTS   EKG: All EKG's are interpreted by the Emergency Department Physician who either signs or Co-signs this chart in the absence of a cardiologist.      RADIOLOGY:   Non-plain film images such as CT, Ultrasound and MRI are read by the radiologist. Plain radiographic images are visualized and preliminarily interpreted by the emergency physician with the below findings:    Interpretation per the Radiologist below, if available at the time of this note:    XR RIBS RIGHT INCLUDE CHEST (MIN 3 VIEWS)   Final Result   Negative for right rib fracture or complication thereof.          ED BEDSIDE ULTRASOUND:   Performed by ED Physician - none    LABS:  Labs Reviewed - No data to display    All other labs were within normal range or not returned as of this dictation.  EMERGENCY DEPARTMENT COURSE/REASSESSMENT and MDM:   Vitals:    Vitals:    12/19/22 1610 12/19/22  1611 12/19/22 1644   BP:  (!) 181/118 (!) 164/105   Pulse: 85  74   Resp: 18  18   Temp: 98.8 F (37.1 C)     SpO2: 98%  96%   Weight: (!) 158.8 kg (350 lb)     Height: 1.854 m (6\' 1" )         ED Course:    ED Course as of 12/19/22 1655   Fri Dec 19, 2022   1652 Patient is in no acute distress.  Easy work of breathing.  I did encourage PCP follow-up. [JS]      ED Course User Index  [JS] Clarene Reamer, MD       MDM  Number of Diagnoses or Management Options  Abscess  Muscle strain of chest wall, initial encounter  Diagnosis management comments: 47 year old male with likely right rib strain.  Will check an x-ray.  He also has an abscess to his left shoulder which is actively draining.  I was able to express the remainder of the pus.  There is a small hole in the area.  We will provide wound care.  I will start the patient on doxycycline.    X-ray of the right wrist was negative for acute process.  I will start the patient on doxycycline for the abscess to the left shoulder.  Will discharge and encourage PCP follow-up.        CONSULTS:  None    FINAL IMPRESSION      1. Muscle strain of chest wall, initial encounter    2. Abscess          DISPOSITION/PLAN   DISPOSITION  PATIENT REFERRED TO:  No follow-up provider specified.    DISCHARGE MEDICATIONS:  New Prescriptions    DOXYCYCLINE HYCLATE (VIBRAMYCIN) 100 MG CAPSULE    Take 1 capsule by mouth 2 times daily for 10 days    NAPROXEN (NAPROSYN) 500 MG TABLET    Take 1 tablet by mouth 2 times daily as needed for Pain              No data to display                (Please note that portions of this note were completed with a voice recognition program.  Efforts were made to edit the dictations but occasionally words are mis-transcribed.)    Clarene Reamer, MD (electronically signed)  Attending Emergency Physician           Clarene Reamer, MD  12/19/22 1655
